# Patient Record
Sex: Female | Born: 1965 | Race: White | Hispanic: No | Marital: Married | Smoking: Current every day smoker
Health system: Southern US, Community
[De-identification: ages and names within clinical notes are randomized; demographics above are authoritative.]

## PROBLEM LIST (undated history)

## (undated) DIAGNOSIS — Z8489 Family history of other specified conditions: Secondary | ICD-10-CM

## (undated) HISTORY — PX: MASTECTOMY: SHX3

## (undated) HISTORY — DX: Family history of other specified conditions: Z84.89

---

## 2021-03-28 ENCOUNTER — Emergency Department
Admission: EM | Admit: 2021-03-28 | Discharge: 2021-03-28 | Disposition: A | Discharge status: Home / Self Care | Payer: Managed Care, Other (non HMO) | Attending: Emergency Medicine | Admitting: Emergency Medicine

## 2021-03-28 ENCOUNTER — Other Ambulatory Visit: Payer: Self-pay

## 2021-03-28 ENCOUNTER — Emergency Department: Payer: Managed Care, Other (non HMO)

## 2021-03-28 DIAGNOSIS — K5732 Diverticulitis of large intestine without perforation or abscess without bleeding: Secondary | ICD-10-CM | POA: Diagnosis not present

## 2021-03-28 DIAGNOSIS — R7989 Other specified abnormal findings of blood chemistry: Secondary | ICD-10-CM

## 2021-03-28 DIAGNOSIS — R945 Abnormal results of liver function studies: Secondary | ICD-10-CM | POA: Insufficient documentation

## 2021-03-28 DIAGNOSIS — R1032 Left lower quadrant pain: Secondary | ICD-10-CM | POA: Diagnosis present

## 2021-03-28 DIAGNOSIS — K5792 Diverticulitis of intestine, part unspecified, without perforation or abscess without bleeding: Secondary | ICD-10-CM

## 2021-03-28 LAB — CBC
HCT: 43.8 % (ref 36.0–46.0)
Hemoglobin: 14.4 g/dL (ref 12.0–15.0)
MCH: 30.1 pg (ref 26.0–34.0)
MCHC: 32.9 g/dL (ref 30.0–36.0)
MCV: 91.6 fL (ref 80.0–100.0)
Platelets: 146 10*3/uL — ABNORMAL LOW (ref 150–400)
RBC: 4.78 MIL/uL (ref 3.87–5.11)
RDW: 13.2 % (ref 11.5–15.5)
WBC: 6.7 10*3/uL (ref 4.0–10.5)
nRBC: 0 % (ref 0.0–0.2)

## 2021-03-28 LAB — URINALYSIS, ROUTINE W REFLEX MICROSCOPIC
Bacteria, UA: NONE SEEN
Bilirubin Urine: NEGATIVE
Glucose, UA: NEGATIVE mg/dL
Hgb urine dipstick: NEGATIVE
Ketones, ur: NEGATIVE mg/dL
Nitrite: NEGATIVE
Protein, ur: NEGATIVE mg/dL
Specific Gravity, Urine: 1.027 (ref 1.005–1.030)
pH: 5 (ref 5.0–8.0)

## 2021-03-28 LAB — COMPREHENSIVE METABOLIC PANEL
ALT: 544 U/L — ABNORMAL HIGH (ref 0–44)
AST: 438 U/L — ABNORMAL HIGH (ref 15–41)
Albumin: 3.7 g/dL (ref 3.5–5.0)
Alkaline Phosphatase: 77 U/L (ref 38–126)
Anion gap: 6 (ref 5–15)
BUN: 20 mg/dL (ref 6–20)
CO2: 26 mmol/L (ref 22–32)
Calcium: 9.3 mg/dL (ref 8.9–10.3)
Chloride: 107 mmol/L (ref 98–111)
Creatinine, Ser: 0.74 mg/dL (ref 0.44–1.00)
GFR, Estimated: >60 mL/min (ref >60)
Glucose, Bld: 126 mg/dL — ABNORMAL HIGH (ref 70–99)
Potassium: 4.2 mmol/L (ref 3.5–5.1)
Sodium: 139 mmol/L (ref 135–145)
Total Bilirubin: 0.9 mg/dL (ref 0.3–1.2)
Total Protein: 7.5 g/dL (ref 6.5–8.1)

## 2021-03-28 LAB — ACETAMINOPHEN LEVEL: Acetaminophen (Tylenol), Serum: <10 ug/mL — ABNORMAL LOW (ref 10–30)

## 2021-03-28 LAB — LIPASE, BLOOD: Lipase: 32 U/L (ref 11–51)

## 2021-03-28 MED ORDER — ONDANSETRON HCL 4 MG/2ML IJ SOLN
4.0000 mg | Freq: Once | INTRAMUSCULAR | Status: AC
  Administered 2021-03-28: 4 mg via INTRAVENOUS
  Filled 2021-03-28: qty 2

## 2021-03-28 MED ORDER — PIPERACILLIN-TAZOBACTAM 3.375 G IVPB 30 MIN
3.3750 g | Freq: Once | INTRAVENOUS | Status: AC
  Administered 2021-03-28: 3.375 g via INTRAVENOUS
  Filled 2021-03-28: qty 50

## 2021-03-28 MED ORDER — OXYCODONE HCL 5 MG PO TABS
5.0000 mg | ORAL_TABLET | Freq: Once | ORAL | Status: AC
  Administered 2021-03-28: 5 mg via ORAL
  Filled 2021-03-28: qty 1

## 2021-03-28 MED ORDER — OXYCODONE HCL 5 MG PO TABS: 5.0000 mg | ORAL_TABLET | Freq: Four times a day (QID) | ORAL | 0 refills | Status: AC | PRN

## 2021-03-28 MED ORDER — SODIUM CHLORIDE 0.9 % IV BOLUS
500.0000 mL | Freq: Once | INTRAVENOUS | Status: AC
  Administered 2021-03-28: 500 mL via INTRAVENOUS

## 2021-03-28 MED ORDER — MORPHINE SULFATE (PF) 4 MG/ML IV SOLN
4.0000 mg | Freq: Once | INTRAVENOUS | Status: AC
  Administered 2021-03-28: 4 mg via INTRAVENOUS
  Filled 2021-03-28: qty 1

## 2021-03-28 MED ORDER — AMOXICILLIN-POT CLAVULANATE 875-125 MG PO TABS: 1.0000 | ORAL_TABLET | Freq: Two times a day (BID) | ORAL | 0 refills | Status: AC

## 2021-03-28 MED ORDER — IOHEXOL 300 MG/ML  SOLN
80.0000 mL | Freq: Once | INTRAMUSCULAR | Status: AC | PRN
  Administered 2021-03-28: 80 mL via INTRAVENOUS

## 2021-03-28 MED ORDER — ONDANSETRON HCL 8 MG PO TABS: 8.0000 mg | ORAL_TABLET | Freq: Three times a day (TID) | ORAL | 0 refills | Status: DC | PRN

## 2021-03-28 NOTE — ED Triage Notes (Signed)
Pt with c/o LLQ pain x 2 weeks. Pt state she has a hx of colitis. Pt states she had diarrhea but that has stopped but the pain is getting worse. Pt denies N/V. Pt states stool was dark. But now is more normal ?

## 2021-03-28 NOTE — ED Provider Notes (Signed)
? ?[facility] ?Provider Note ? ? ? Event Date/Time  ? First MD Initiated Contact with Patient 03/28/21 1653   ?  (approximate) ? ? ?History  ? ?Abdominal Pain ? ? ?HPI ? ?Nathanial MillmanCheryl Wach is a [age] y.o. female with a prior history of colitis who presents with left lower quadrant abdominal pain for the last 2 weeks, worsening over the last few days, associated with diarrhea initially, but this has been resolving.  She has had no blood in her stool.  She thought that she was feeling better for a few days until the pain worsened.  She states it feels similar to when she had colitis previously.  She denies any vomiting or fever.  She denies any urinary symptoms. ? ? ? ?Physical Exam  ? ?Triage Vital Signs: ?ED Triage Vitals  ?Enc Vitals Group  ?   BP 03/28/21 1639 (!) 162/87  ?   Pulse Rate 03/28/21 1639 (!) 102  ?   Resp 03/28/21 1639 20  ?   Temp 03/28/21 1639 97.8 ?F (36.6 ?C)  ?   Temp Source 03/28/21 1639 Oral  ?   SpO2 03/28/21 1639 99 %  ?   Weight 03/28/21 1642 120 lb (54.4 kg)  ?   Height 03/28/21 1642 5\' 2"  (1.575 m)  ?   Head Circumference --   ?   Peak Flow --   ?   Pain Score 03/28/21 1642 8  ?   Pain Loc --   ?   Pain Edu? --   ?   Excl. in GC? --   ? ? ?Most recent vital signs: ?Vitals:  ? 03/28/21 2130 03/28/21 2230  ?BP: 120/66 117/67  ?Pulse: 81 78  ?Resp: 16 19  ?Temp:    ?SpO2: 97% 100%  ? ? ? ?General: Alert, uncomfortable appearing but in no acute distress. ?CV:  Good peripheral perfusion.  ?Resp:  Normal effort.  ?Abd:  Soft with moderate left lower quadrant tenderness.  No distention.  ?Other:  No scleral icterus.  Dry mucous membranes. ? ? ? ?ED Results / Procedures / Treatments  ? ?Labs ?(all labs ordered are listed, but only abnormal results are displayed) ?Labs Reviewed  ?COMPREHENSIVE METABOLIC PANEL - Abnormal; Notable for the following components:  ?    Result Value  ? Glucose, Bld 126 (*)   ? AST 438 (*)   ? ALT 544 (*)   ? All other components within normal limits   ?CBC - Abnormal; Notable for the following components:  ? Platelets 146 (*)   ? All other components within normal limits  ?URINALYSIS, ROUTINE W REFLEX MICROSCOPIC - Abnormal; Notable for the following components:  ? Color, Urine AMBER (*)   ? APPearance CLOUDY (*)   ? Leukocytes,Ua SMALL (*)   ? All other components within normal limits  ?ACETAMINOPHEN LEVEL - Abnormal; Notable for the following components:  ? Acetaminophen (Tylenol), Serum <10 (*)   ? All other components within normal limits  ?LIPASE, BLOOD  ?HEPATITIS PANEL, ACUTE  ? ? ? ?EKG ? ?ED ECG REPORT ?IDionne Bucy, Llana Deshazo, the attending physician, personally viewed and interpreted this ECG. ? ?Date: 03/28/2021 ?EKG Time: 1649 ?Rate: 96 ?Rhythm: normal sinus rhythm ?QRS Axis: normal ?Intervals: normal ?ST/T Wave abnormalities: normal ?Narrative Interpretation: no evidence of acute ischemia ? ? ? ?RADIOLOGY ? ?CT abdomen/pelvis: Dependently viewed and interpreted the images; there is diverticulitis of the colon with no evidence of perforation, abscess, or other complication ? ? ?  PROCEDURES: ? ?Critical Care performed: No ? ?Procedures ? ? ?MEDICATIONS ORDERED IN ED: ?Medications  ?ondansetron (ZOFRAN) injection 4 mg (4 mg Intravenous Given 03/28/21 1831)  ?morphine (PF) 4 MG/ML injection 4 mg (4 mg Intravenous Given 03/28/21 1831)  ?sodium chloride 0.9 % bolus 500 mL (0 mLs Intravenous Stopped 03/28/21 2034)  ?iohexol (OMNIPAQUE) 300 MG/ML solution 80 mL (80 mLs Intravenous Contrast Given 03/28/21 1843)  ?piperacillin-tazobactam (ZOSYN) IVPB 3.375 g (0 g Intravenous Stopped 03/28/21 2139)  ?morphine (PF) 4 MG/ML injection 4 mg (4 mg Intravenous Given 03/28/21 2029)  ?oxyCODONE (Oxy IR/ROXICODONE) immediate release tablet 5 mg (5 mg Oral Given 03/28/21 2317)  ? ? ? ?IMPRESSION / MDM / ASSESSMENT AND PLAN / ED COURSE  ?I reviewed the triage vital signs and the nursing notes. ? ?56 year old female with PMH as noted above presents with left lower quadrant pain  over the last [redacted] weeks along with diarrhea which has mostly subsided.  It feels similar to prior episodes of colitis. ? ?Differential diagnosis includes, but is not limited to, infectious or inflammatory colitis, IBD, diverticulitis, gastroenteritis, ureteral stone, pyelonephritis.  We will obtain lab work-up, CT abdomen, give analgesia and fluids and reassess. ? ?----------------------------------------- ?11:19 PM on 03/28/2021 ?----------------------------------------- ? ?CT shows uncomplicated diverticulitis. ? ?Lab work-up is overall reassuring.  There is no leukocytosis.  LFTs are elevated but the bilirubin and alkaline phosphatase are normal. ? ?The patient has a history of hepatitis C which is untreated.  I consulted Dr. Mia Creek from GI and discussed the case with him.  He recommends getting an acute hepatitis panel and checking an acetaminophen level.  He advises that the elevated LFTs could be inflammatory or related to the patient's hepatitis C, and she should be appropriate for outpatient follow-up.  We have no prior labs to compare with since the patient recently moved to the area and there are no prior records in care everywhere. ? ?On reassessment, the patient reported significant improvement in her pain.  I did consider whether she may require admission, and discussed with the patient, however her vital signs are normal, she has been tolerating p.o., and does not have complications such as abscess or perforation.  Therefore she is appropriate for discharge home with outpatient treatment.  The patient herself strongly prefers discharge if possible. ? ?We gave a dose of antibiotics here.  The acetaminophen level is normal.  Per the lab, the hepatitis panel takes several days to result.  Therefore, we will not keep the patient here for this. ? ?I counseled the patient on the results of the work-up and the plan of care including follow-up with GI.  I gave her thorough return precautions and she expressed  understanding. ? ? ?FINAL CLINICAL IMPRESSION(S) / ED DIAGNOSES  ? ?Final diagnoses:  ?Diverticulitis  ?Elevated LFTs  ? ? ? ?Rx / DC Orders  ? ?ED Discharge Orders   ? ?      Ordered  ?  oxyCODONE (ROXICODONE) 5 MG immediate release tablet  Every 6 hours PRN       ? 03/28/21 2316  ?  amoxicillin-clavulanate (AUGMENTIN) 875-125 MG tablet  2 times daily       ? 03/28/21 2316  ?  ondansetron (ZOFRAN) 8 MG tablet  Every 8 hours PRN       ? 03/28/21 2316  ? ?  ?  ? ?  ? ? ? ?Note:  This document was prepared using Dragon voice recognition software and may include unintentional dictation errors.  ?  ?  Dionne Bucy, MD ?03/28/21 2325 ? ?

## 2021-03-28 NOTE — Discharge Instructions (Addendum)
Your liver enzymes (AST and ALT) are somewhat elevated.  This may be due to inflammation.  We have given you a referral to a gastroenterologist.  The office should contact you within the next few days to schedule an appointment but if you do not hear from them you should call. ? ?Take the antibiotic as prescribed and finish the full course even if you are feeling better.  You may take the oxycodone as needed for pain over the next few days, and the Zofran as needed for nausea. ? ?Return to the ER for new, worsening, or persistent severe pain, vomiting or inability take the medication, fever or chills, blood in the stool, jaundice, or any other new or worsening symptoms that concern you. ?

## 2021-03-29 LAB — HEPATITIS PANEL, ACUTE
HCV Ab: REACTIVE — AB
Hep A IgM: NONREACTIVE
Hep B C IgM: NONREACTIVE
Hepatitis B Surface Ag: NONREACTIVE

## 2021-05-16 ENCOUNTER — Emergency Department: Payer: Managed Care, Other (non HMO)

## 2021-05-16 ENCOUNTER — Other Ambulatory Visit: Payer: Self-pay

## 2021-05-16 ENCOUNTER — Emergency Department
Admission: EM | Admit: 2021-05-16 | Discharge: 2021-05-16 | Disposition: A | Discharge status: Home / Self Care | Payer: Managed Care, Other (non HMO) | Attending: Student in an Organized Health Care Education/Training Program | Admitting: Student in an Organized Health Care Education/Training Program

## 2021-05-16 DIAGNOSIS — S82001A Unspecified fracture of right patella, initial encounter for closed fracture: Secondary | ICD-10-CM

## 2021-05-16 DIAGNOSIS — S82041A Displaced comminuted fracture of right patella, initial encounter for closed fracture: Secondary | ICD-10-CM | POA: Diagnosis not present

## 2021-05-16 DIAGNOSIS — W01198A Fall on same level from slipping, tripping and stumbling with subsequent striking against other object, initial encounter: Secondary | ICD-10-CM | POA: Insufficient documentation

## 2021-05-16 DIAGNOSIS — M542 Cervicalgia: Secondary | ICD-10-CM | POA: Diagnosis not present

## 2021-05-16 DIAGNOSIS — F10129 Alcohol abuse with intoxication, unspecified: Secondary | ICD-10-CM | POA: Insufficient documentation

## 2021-05-16 DIAGNOSIS — W19XXXA Unspecified fall, initial encounter: Secondary | ICD-10-CM

## 2021-05-16 DIAGNOSIS — S0990XA Unspecified injury of head, initial encounter: Secondary | ICD-10-CM | POA: Diagnosis not present

## 2021-05-16 DIAGNOSIS — S8991XA Unspecified injury of right lower leg, initial encounter: Secondary | ICD-10-CM | POA: Diagnosis present

## 2021-05-16 MED ORDER — ONDANSETRON 4 MG PO TBDP
4.0000 mg | ORAL_TABLET | Freq: Once | ORAL | Status: AC
  Administered 2021-05-16: 4 mg via ORAL
  Filled 2021-05-16: qty 1

## 2021-05-16 MED ORDER — OXYCODONE-ACETAMINOPHEN 5-325 MG PO TABS: 1.0000 | ORAL_TABLET | ORAL | 0 refills | Status: DC | PRN

## 2021-05-16 MED ORDER — MORPHINE SULFATE (PF) 4 MG/ML IV SOLN
4.0000 mg | Freq: Once | INTRAVENOUS | Status: AC
  Administered 2021-05-16: 4 mg via INTRAMUSCULAR
  Filled 2021-05-16: qty 1

## 2021-05-16 MED ORDER — OXYCODONE-ACETAMINOPHEN 5-325 MG PO TABS
1.0000 | ORAL_TABLET | Freq: Once | ORAL | Status: AC
  Administered 2021-05-16: 1 via ORAL
  Filled 2021-05-16: qty 1

## 2021-05-16 NOTE — ED Provider Notes (Signed)
? ?[facility] ?Provider Note ? ? ? Event Date/Time  ? First MD Initiated Contact with Patient 05/16/21 1326   ?  (approximate) ? ? ?History  ? ?Fall ? ? ?HPI ? ?Marcus Groll is a 56 y.o. female with no pertinent past medical history.  Patient states she was intoxicated last night and fell.  Family member states she also did hit her head.  Unsure if she lost consciousness.  Patient is unable to move the right knee.  Has large amount of swelling.  Cannot walk on it without pain.  States she has had believe it in a bent position.  She denies vomiting., ? ?  ? ? ?Physical Exam  ? ?Triage Vital Signs: ?ED Triage Vitals  ?Enc Vitals Group  ?   BP 05/16/21 1246 (!) 175/93  ?   Pulse Rate 05/16/21 1246 (!) 118  ?   Resp 05/16/21 1246 20  ?   Temp 05/16/21 1246 98.5 ?F (36.9 ?C)  ?   Temp Source 05/16/21 1246 Oral  ?   SpO2 05/16/21 1246 96 %  ?   Weight 05/16/21 1247 115 lb (52.2 kg)  ?   Height 05/16/21 1247 5\' 2"  (1.575 m)  ?   Head Circumference --   ?   Peak Flow --   ?   Pain Score 05/16/21 1247 10  ?   Pain Loc --   ?   Pain Edu? --   ?   Excl. in GC? --   ? ? ?Most recent vital signs: ?Vitals:  ? 05/16/21 1354 05/16/21 1600  ?BP: (!) 149/58 (!) 151/64  ?Pulse: 95 80  ?Resp: (!) 22 18  ?Temp:    ?SpO2: 98% 95%  ? ? ? ?General: Awake, no distress.   ?CV:  Good peripheral perfusion. regular rate and  rhythm ?Resp:  Normal effort.  ?Abd:  No distention.   ?Other:  Skull is nontender, minimal tenderness of the C-spine, right knee is swollen and tender, patient is unable to straighten the leg or bend the knee.  She is able to move her toes but unable to move the ankle secondary to pain ? ? ?ED Results / Procedures / Treatments  ? ?Labs ?(all labs ordered are listed, but only abnormal results are displayed) ?Labs Reviewed - No data to display ? ? ?EKG ? ? ? ? ?RADIOLOGY ?X-ray of the right knee ?CT of the head, C-spine and right knee ? ? ? ?PROCEDURES: ? ? ?Procedures ? ? ?MEDICATIONS ORDERED IN  ED: ?Medications  ?morphine (PF) 4 MG/ML injection 4 mg (4 mg Intramuscular Given 05/16/21 1356)  ?ondansetron (ZOFRAN-ODT) disintegrating tablet 4 mg (4 mg Oral Given 05/16/21 1356)  ?oxyCODONE-acetaminophen (PERCOCET/ROXICET) 5-325 MG per tablet 1 tablet (1 tablet Oral Given 05/16/21 1619)  ? ? ? ?IMPRESSION / MDM / ASSESSMENT AND PLAN / ED COURSE  ?I reviewed the triage vital signs and the nursing notes. ?             ?               ? ?Differential diagnosis includes, but is not limited to, patella fracture, tibial plateau fracture, effusion, contusion, CVA, SAH, subdural hematoma, C-spine fracture ? ?X-ray of the right knee shows a patella fracture, is comminuted and displaced.  Due to the amount of swelling and inability to straighten the knee we will add a CT of the right knee to look for internal derangement. ? ?Also due to her  being intoxicated during the fall and not remembering the fall, will do CT of the head and C-spine ? ?Morphine 4 mg IM and Zofran ODT were ordered. ? ?CT of the head and C-spine were independently reviewed by me and do not show any acute abnormalities.  Confirmed by radiology ? ?CT of the right knee is independently reviewed by me confirms comminuted fracture of the patella, no tibial plateau fracture noted.  Confirmed by radiology ? ?The patient did have relief with morphine.  Gave her Percocet prior to discharge.  Secure message sent to Dr. Okey Dupre for follow-up appointment.  Patient was placed in a knee immobilizer and given a walker.  She is not to bear weight on the leg.  She is to leave the knee immobilizer on until evaluated by orthopedics.  Ply ice and elevate.  Patient is in agreement treatment plan.  She was discharged stable condition. ? ? ?  ? ? ?FINAL CLINICAL IMPRESSION(S) / ED DIAGNOSES  ? ?Final diagnoses:  ?Closed displaced fracture of right patella, unspecified fracture morphology, initial encounter  ?Fall, initial encounter  ?Minor head injury, initial encounter   ? ? ? ?Rx / DC Orders  ? ?ED Discharge Orders   ? ?      Ordered  ?  oxyCODONE-acetaminophen (PERCOCET) 5-325 MG tablet  Every 4 hours PRN       ? 05/16/21 1717  ? ?  ?  ? ?  ? ? ? ?Note:  This document was prepared using Dragon voice recognition software and may include unintentional dictation errors. ? ?  ?Faythe Ghee, PA-C ?05/16/21 1727 ? ?  ?Willy Eddy, MD ?05/16/21 2005 ? ?

## 2021-05-16 NOTE — ED Notes (Signed)
Pink sleeve placed on patient's right arm due to right-side mastectomy. ?

## 2021-05-16 NOTE — ED Triage Notes (Signed)
Pt arrive with c/o right knee pain after she fell last night. Pt was intoxicated when she fell and does not remember the fall.  ?

## 2021-05-16 NOTE — Discharge Instructions (Signed)
Follow-up with your regular doctor as needed.  Follow-up with Dr. Okey Dupre at [facility] for evaluation of your patella fracture.  Leave the knee immobilizer, cover it with a bag when you get in the shower, use the walker to not bear weight.  Return the emergency department for any worsening.  Take Percocet as needed for pain. ?

## 2021-12-22 ENCOUNTER — Ambulatory Visit (INDEPENDENT_AMBULATORY_CARE_PROVIDER_SITE_OTHER): Payer: Managed Care, Other (non HMO) | Admitting: Nurse Practitioner

## 2021-12-22 ENCOUNTER — Encounter: Payer: Self-pay | Admitting: Nurse Practitioner

## 2021-12-22 VITALS — BP 128/86 | HR 64 | Ht 62.0 in | Wt 127.0 lb

## 2021-12-22 DIAGNOSIS — Z8619 Personal history of other infectious and parasitic diseases: Secondary | ICD-10-CM | POA: Insufficient documentation

## 2021-12-22 DIAGNOSIS — F32A Depression, unspecified: Secondary | ICD-10-CM

## 2021-12-22 DIAGNOSIS — Z853 Personal history of malignant neoplasm of breast: Secondary | ICD-10-CM

## 2021-12-22 DIAGNOSIS — G4700 Insomnia, unspecified: Secondary | ICD-10-CM

## 2021-12-22 MED ORDER — SERTRALINE HCL 50 MG PO TABS: 75.0000 mg | ORAL_TABLET | Freq: Every day | ORAL | 3 refills | Status: DC

## 2021-12-22 MED ORDER — TRAZODONE HCL 50 MG PO TABS: 25.0000 mg | ORAL_TABLET | Freq: Every evening | ORAL | 3 refills | Status: DC | PRN

## 2021-12-22 NOTE — Assessment & Plan Note (Signed)
-   Ambulatory referral to Gastroenterology - CBC - Comprehensive metabolic panel  2. Depression, unspecified depression type  - sertraline (ZOLOFT) 50 MG tablet; Take 1.5 tablets (75 mg total) by mouth daily.  Dispense: 30 tablet; Refill: 3 - CBC - Comprehensive metabolic panel  3. Insomnia, unspecified type  - traZODone (DESYREL) 50 MG tablet; Take 0.5-1 tablets (25-50 mg total) by mouth at bedtime as needed for sleep.  Dispense: 30 tablet; Refill: 3   4. History of breast cancer  - Ambulatory referral to Hematology / Oncology    Follow up:  Follow up in 3 months

## 2021-12-22 NOTE — Patient Instructions (Addendum)
1. History of hepatitis C  - Ambulatory referral to Gastroenterology - CBC - Comprehensive metabolic panel  2. Depression, unspecified depression type  - sertraline (ZOLOFT) 50 MG tablet; Take 1.5 tablets (75 mg total) by mouth daily.  Dispense: 30 tablet; Refill: 3 - CBC - Comprehensive metabolic panel  3. Insomnia, unspecified type  - traZODone (DESYREL) 50 MG tablet; Take 0.5-1 tablets (25-50 mg total) by mouth at bedtime as needed for sleep.  Dispense: 30 tablet; Refill: 3   4. History of breast cancer  - Ambulatory referral to Hematology / Oncology    Follow up:  Follow up in 3 months

## 2021-12-22 NOTE — Progress Notes (Signed)
@Patient  ID: , female    DOB: September 13, 1965, 56 y.o.   MRN: [id number]  Chief Complaint  Patient presents with   New Patient (Initial Visit)    Pt stated--need antidepressant medication and treatment for Hep C.    Referring provider: No ref. provider found   HPI  Patient presents today to establish care.  She states that she was diagnosed with hepatitis C 4 years ago when [place] was [phone].  She also states that she has a history of breast cancer from 5 years ago with mastectomy.  This was also in [place].  Patient did not follow-up on treatment for either of these issues.  We will refer her to GI and oncology today for follow-up on this.  Patient stated she is having bone pain and is concerned that her breast cancer has metastasized to her bones.  We will try to request records from healthcare system in [place].  Denies f/c/s, n/v/d, hemoptysis, PND, leg swelling Denies chest pain or edema     No Known Allergies   There is no immunization history on file for this patient.  Past Medical History:  Diagnosis Date   FH: mastectomy     Tobacco History: Social History   Tobacco Use  Smoking Status Every Day   Types: Cigarettes  Smokeless Tobacco Never   Ready to quit: Not Answered Counseling given: Not Answered   Outpatient Encounter Medications as of 12/22/2021  Medication Sig   sertraline (ZOLOFT) 50 MG tablet Take 1.5 tablets (75 mg total) by mouth daily.   traZODone (DESYREL) 50 MG tablet Take 0.5-1 tablets (25-50 mg total) by mouth at bedtime as needed for sleep.   [DISCONTINUED] ondansetron (ZOFRAN) 8 MG tablet Take 1 tablet (8 mg total) by mouth every 8 (eight) hours as needed for nausea or vomiting.   [DISCONTINUED] oxyCODONE-acetaminophen (PERCOCET) 5-325 MG tablet Take 1 tablet by mouth every 4 (four) hours as needed for severe pain.   No facility-administered encounter medications on file as of 12/22/2021.     Review of Systems  Review  of Systems  Constitutional: Negative.   HENT: Negative.    Cardiovascular: Negative.   Gastrointestinal: Negative.   Allergic/Immunologic: Negative.   Neurological: Negative.   Psychiatric/Behavioral: Negative.         Physical Exam  BP 128/86   Pulse 64   Ht 5\' 2"  (1.575 m)   Wt 127 lb (57.6 kg)   SpO2 98%   BMI 23.23 kg/m   Wt Readings from Last 5 Encounters:  12/22/21 127 lb (57.6 kg)  05/16/21 115 lb (52.2 kg)  03/28/21 120 lb (54.4 kg)     Physical Exam Vitals and nursing note reviewed.  Constitutional:      General: She is not in acute distress.    Appearance: She is well-developed.  Cardiovascular:     Rate and Rhythm: Normal rate and regular rhythm.  Pulmonary:     Effort: Pulmonary effort is normal.     Breath sounds: Normal breath sounds.  Neurological:     Mental Status: She is alert and oriented to person, place, and time.         Assessment & Plan:   History of hepatitis C - Ambulatory referral to Gastroenterology - CBC - Comprehensive metabolic panel  2. Depression, unspecified depression type  - sertraline (ZOLOFT) 50 MG tablet; Take 1.5 tablets (75 mg total) by mouth daily.  Dispense: 30 tablet; Refill: 3 - CBC - Comprehensive metabolic panel  3. Insomnia, unspecified type  - traZODone (DESYREL) 50 MG tablet; Take 0.5-1 tablets (25-50 mg total) by mouth at bedtime as needed for sleep.  Dispense: 30 tablet; Refill: 3   4. History of breast cancer  - Ambulatory referral to Hematology / Oncology    Follow up:  Follow up in 3 months     Ivonne Andrew, NP 12/22/2021

## 2021-12-23 ENCOUNTER — Other Ambulatory Visit: Payer: Self-pay | Admitting: Nurse Practitioner

## 2021-12-23 DIAGNOSIS — Z8619 Personal history of other infectious and parasitic diseases: Secondary | ICD-10-CM

## 2021-12-23 LAB — COMPREHENSIVE METABOLIC PANEL
ALT: 172 IU/L — ABNORMAL HIGH (ref 0–32)
AST: 160 IU/L — ABNORMAL HIGH (ref 0–40)
Albumin/Globulin Ratio: 1.3 (ref 1.2–2.2)
Albumin: 4.5 g/dL (ref 3.8–4.9)
Alkaline Phosphatase: 117 IU/L (ref 44–121)
BUN/Creatinine Ratio: 18 (ref 9–23)
BUN: 14 mg/dL (ref 6–24)
Bilirubin Total: 0.4 mg/dL (ref 0.0–1.2)
CO2: 23 mmol/L (ref 20–29)
Calcium: 10.3 mg/dL — ABNORMAL HIGH (ref 8.7–10.2)
Chloride: 104 mmol/L (ref 96–106)
Creatinine, Ser: 0.78 mg/dL (ref 0.57–1.00)
Globulin, Total: 3.6 g/dL (ref 1.5–4.5)
Glucose: 87 mg/dL (ref 70–99)
Potassium: 4.3 mmol/L (ref 3.5–5.2)
Sodium: 142 mmol/L (ref 134–144)
Total Protein: 8.1 g/dL (ref 6.0–8.5)
eGFR: 89 mL/min/{1.73_m2} (ref >59)

## 2021-12-23 LAB — CBC
Hematocrit: 42.9 % (ref 34.0–46.6)
Hemoglobin: 14.4 g/dL (ref 11.1–15.9)
MCH: 30.6 pg (ref 26.6–33.0)
MCHC: 33.6 g/dL (ref 31.5–35.7)
MCV: 91 fL (ref 79–97)
Platelets: 204 10*3/uL (ref 150–450)
RBC: 4.7 x10E6/uL (ref 3.77–5.28)
RDW: 13.1 % (ref 11.7–15.4)
WBC: 6.5 10*3/uL (ref 3.4–10.8)

## 2021-12-30 ENCOUNTER — Encounter: Payer: Self-pay | Admitting: Infectious Diseases

## 2022-02-01 ENCOUNTER — Ambulatory Visit (INDEPENDENT_AMBULATORY_CARE_PROVIDER_SITE_OTHER): Payer: 59 | Admitting: Internal Medicine

## 2022-02-01 ENCOUNTER — Other Ambulatory Visit (HOSPITAL_COMMUNITY): Payer: Self-pay

## 2022-02-01 ENCOUNTER — Encounter: Payer: Self-pay | Admitting: Internal Medicine

## 2022-02-01 ENCOUNTER — Other Ambulatory Visit: Payer: Self-pay

## 2022-02-01 ENCOUNTER — Telehealth: Payer: Self-pay

## 2022-02-01 VITALS — BP 154/87 | HR 70 | Temp 97.8°F | Wt 130.0 lb

## 2022-02-01 DIAGNOSIS — B182 Chronic viral hepatitis C: Secondary | ICD-10-CM | POA: Diagnosis not present

## 2022-02-01 DIAGNOSIS — R69 Illness, unspecified: Secondary | ICD-10-CM | POA: Diagnosis not present

## 2022-02-01 NOTE — Progress Notes (Signed)
[name]      Reason for Consult: chronic hepatitis C    Referring Physician: Eduardo Osier, NP    Patient ID: Carrie Holt, female    DOB: [birth year], 57 y.o.   MRN: [id number]  HPI:   Carrie Holt is here for evaluation for known chronic hepatitis C. She was diagnosed last year in [facility] but did not start treatment.  She has a long history of significant fatigue and attributes it to hepatitis C.  She suspects she has had it for decades.  She has increased AST and ALT.  She is interested in treatment.  Also with a history of breast cancer and going to oncology.   Past Medical History:  Diagnosis Date   FH: mastectomy     Prior to Admission medications   Medication Sig Start Date End Date Taking? Authorizing Provider  sertraline (ZOLOFT) 50 MG tablet Take 1.5 tablets (75 mg total) by mouth daily. 12/22/21  Yes Fenton Foy, NP  traZODone (DESYREL) 50 MG tablet Take 0.5-1 tablets (25-50 mg total) by mouth at bedtime as needed for sleep. 12/22/21  Yes Fenton Foy, NP    No Known Allergies  Social History   Tobacco Use   Smoking status: Every Day    Types: E-cigarettes   Smokeless tobacco: Never   Tobacco comments:    Cutting down  Substance Use Topics   Alcohol use: Not Currently   Drug use: Never    Family History  Problem Relation Age of Onset   Heart attack Mother    Cancer Father    Heart attack Father     Review of Systems  Constitutional: positive for fatigue and malaise All other systems reviewed and are negative    Constitutional: in no apparent distress  Vitals:   02/01/22 0843  BP: (!) 154/87  Pulse: 70  Temp: 97.8 F (36.6 C)  SpO2: 99%   EYES: anicteric Respiratory: normal respiratory effort Musculoskeletal: no edema Skin: no rash  Labs: Lab Results  Component Value Date   WBC 6.5 12/22/2021   HGB 14.4 12/22/2021   HCT 42.9 12/22/2021   MCV 91 12/22/2021   PLT 204 12/22/2021    Lab Results  Component  Value Date   CREATININE 0.78 12/22/2021   BUN 14 12/22/2021   NA 142 12/22/2021   K 4.3 12/22/2021   CL 104 12/22/2021   CO2 23 12/22/2021    Lab Results  Component Value Date   ALT 172 (H) 12/22/2021   AST 160 (H) 12/22/2021   ALKPHOS 117 12/22/2021   BILITOT 0.4 12/22/2021     Assessment: likely chronic active hepatitis C.   Will confirm with labs but with previous positive by her report and notable transaminitis, suspect it is likely true.   Will get ultrasound to check for cirrhosis.  Discussed treatment options   Plan: 1)  labs today 2) ultrasound with elastography Will prescribe Harvoni or Epclusa per insurance once results come out She will follow up with me 3-4 weeks after starting treatment.

## 2022-02-01 NOTE — Telephone Encounter (Signed)
[facility] Patient Teacher, English as a foreign language completed.    The patient is insured through [employer].  Patient will have to try and fail Epclusa or Harvoni 1st before trying [place].   We will continue to follow to see if copay assistance is needed.  [name], [facility] Patient [facility] Phone: [phone] Fax:  [phone]

## 2022-02-03 ENCOUNTER — Ambulatory Visit (HOSPITAL_COMMUNITY)
Admission: RE | Admit: 2022-02-03 | Discharge: 2022-02-03 | Disposition: A | Discharge status: Home / Self Care | Payer: 59 | Source: Ambulatory Visit | Attending: Internal Medicine | Admitting: Internal Medicine

## 2022-02-03 DIAGNOSIS — B182 Chronic viral hepatitis C: Secondary | ICD-10-CM

## 2022-02-03 DIAGNOSIS — R69 Illness, unspecified: Secondary | ICD-10-CM | POA: Diagnosis not present

## 2022-02-04 LAB — HEPATITIS C RNA QUANTITATIVE
HCV Quantitative Log: 5.45 log IU/mL — ABNORMAL HIGH
HCV RNA, PCR, QN: 279000 IU/mL — ABNORMAL HIGH

## 2022-02-04 LAB — HEPATITIS C GENOTYPE

## 2022-02-04 LAB — HEPATITIS B CORE ANTIBODY, TOTAL: Hep B Core Total Ab: NONREACTIVE

## 2022-02-04 LAB — HIV ANTIBODY (ROUTINE TESTING W REFLEX): HIV 1&2 Ab, 4th Generation: NONREACTIVE

## 2022-02-04 LAB — HEPATITIS B SURFACE ANTIBODY,QUALITATIVE: Hep B S Ab: NONREACTIVE

## 2022-02-04 LAB — HEPATITIS A ANTIBODY, TOTAL: Hepatitis A AB,Total: NONREACTIVE

## 2022-02-04 LAB — HEPATITIS B SURFACE ANTIGEN: Hepatitis B Surface Ag: NONREACTIVE

## 2022-02-09 ENCOUNTER — Other Ambulatory Visit (HOSPITAL_COMMUNITY): Payer: Self-pay

## 2022-02-09 ENCOUNTER — Other Ambulatory Visit: Payer: Self-pay | Admitting: Internal Medicine

## 2022-02-09 MED ORDER — SOFOSBUVIR-VELPATASVIR 400-100 MG PO TABS
1.0000 | ORAL_TABLET | Freq: Every day | ORAL | 2 refills | Status: DC
  Filled 2022-02-09: qty 28, 28d supply, fill #0

## 2022-02-10 ENCOUNTER — Telehealth: Payer: Self-pay

## 2022-02-10 ENCOUNTER — Other Ambulatory Visit (HOSPITAL_COMMUNITY): Payer: Self-pay

## 2022-02-10 NOTE — Telephone Encounter (Signed)
[facility] Patient Advocate Encounter   Received notification from [employer] that prior authorization for Carrie Holt is required.   PA submitted on 02/10/22 Key [id number] Status is pending    [facility] will continue to follow.   [name], [facility] Patient [facility] Phone: [phone] Fax:  [phone]

## 2022-02-14 ENCOUNTER — Other Ambulatory Visit (HOSPITAL_COMMUNITY): Payer: Self-pay

## 2022-02-17 ENCOUNTER — Other Ambulatory Visit (HOSPITAL_COMMUNITY): Payer: Self-pay

## 2022-02-21 ENCOUNTER — Other Ambulatory Visit (HOSPITAL_COMMUNITY): Payer: Self-pay

## 2022-02-22 ENCOUNTER — Encounter: Payer: Self-pay | Admitting: Internal Medicine

## 2022-03-03 ENCOUNTER — Other Ambulatory Visit (HOSPITAL_COMMUNITY): Payer: Self-pay

## 2022-03-03 ENCOUNTER — Other Ambulatory Visit: Payer: Self-pay | Admitting: Pharmacist

## 2022-03-03 DIAGNOSIS — B182 Chronic viral hepatitis C: Secondary | ICD-10-CM

## 2022-03-03 MED ORDER — EPCLUSA 400-100 MG PO TABS: 1.0000 | ORAL_TABLET | Freq: Every day | ORAL | 2 refills | Status: AC

## 2022-03-07 ENCOUNTER — Telehealth: Payer: Self-pay

## 2022-03-07 NOTE — Telephone Encounter (Signed)
[facility] Patient Advocate Encounter  Prior Authorization for Carrie Holt [employer]) has been approved.    PA# [id number] [place] Effective dates: 03/05/22 through 05/28/22    [facility] will continue to follow.  [name], [facility] Patient [facility] Phone: [phone] Fax:  [phone]

## 2022-03-15 ENCOUNTER — Ambulatory Visit: Payer: 59 | Admitting: Internal Medicine

## 2022-03-23 ENCOUNTER — Ambulatory Visit (INDEPENDENT_AMBULATORY_CARE_PROVIDER_SITE_OTHER): Payer: 59 | Admitting: Nurse Practitioner

## 2022-03-23 ENCOUNTER — Encounter: Payer: Self-pay | Admitting: Nurse Practitioner

## 2022-03-23 VITALS — BP 157/80 | HR 81 | Temp 97.5°F | Ht 62.0 in | Wt 130.6 lb

## 2022-03-23 DIAGNOSIS — M79605 Pain in left leg: Secondary | ICD-10-CM

## 2022-03-23 DIAGNOSIS — M79672 Pain in left foot: Secondary | ICD-10-CM | POA: Diagnosis not present

## 2022-03-23 DIAGNOSIS — M79604 Pain in right leg: Secondary | ICD-10-CM

## 2022-03-23 DIAGNOSIS — M79671 Pain in right foot: Secondary | ICD-10-CM

## 2022-03-23 DIAGNOSIS — R21 Rash and other nonspecific skin eruption: Secondary | ICD-10-CM

## 2022-03-23 DIAGNOSIS — Z131 Encounter for screening for diabetes mellitus: Secondary | ICD-10-CM

## 2022-03-23 LAB — POCT GLYCOSYLATED HEMOGLOBIN (HGB A1C): Hemoglobin A1C: 5.8 % — AB (ref 4.0–5.6)

## 2022-03-23 MED ORDER — PREDNISONE 20 MG PO TABS: 20.0000 mg | ORAL_TABLET | Freq: Every day | ORAL | 0 refills | Status: DC

## 2022-03-23 MED ORDER — IBUPROFEN 600 MG PO TABS: 600.0000 mg | ORAL_TABLET | Freq: Three times a day (TID) | ORAL | 0 refills | Status: DC | PRN

## 2022-03-23 NOTE — Patient Instructions (Addendum)
1. Bilateral pain of leg and foot  - predniSONE (DELTASONE) 20 MG tablet; Take 1 tablet (20 mg total) by mouth daily with breakfast.  Dispense: 5 tablet; Refill: 0 - ibuprofen (ADVIL) 600 MG tablet; Take 1 tablet (600 mg total) by mouth every 8 (eight) hours as needed.  Dispense: 30 tablet; Refill: 0  2. Rash and other nonspecific skin eruption  - predniSONE (DELTASONE) 20 MG tablet; Take 1 tablet (20 mg total) by mouth daily with breakfast.  Dispense: 5 tablet; Refill: 0

## 2022-03-23 NOTE — Assessment & Plan Note (Signed)
2. Rash and other nonspecific skin eruption Rashes appears stated compared to the picture she presented on her phone.  Rashes not itchy, no discharge or redness noted - predniSONE (DELTASONE) 20 MG tablet; Take 1 tablet (20 mg total) by mouth daily with breakfast.  Dispense: 5 tablet; Refill: 0 - POCT glycosylated hemoglobin (Hb A1C)

## 2022-03-23 NOTE — Assessment & Plan Note (Addendum)
1. Bilateral pain of leg and foot  - predniSONE (DELTASONE) 20 MG tablet; Take 1 tablet (20 mg total) by mouth daily with breakfast.  Dispense: 5 tablet; Refill: 0 - ibuprofen (ADVIL) 600 MG tablet; Take 1 tablet (600 mg total) by mouth every 8 (eight) hours as needed.  Dispense: 30 tablet; Refill: 0 Patient told to take prednisone with meal.  For 5 days after 5 days take ibuprofen 600 mg every 8 hours as needed.  Follow-up with orthopedics Follow-up in the office in 1 week She has history of breast cancer she was concerned about her breast cancer metastasizing to her bones during her last visit, referral to oncology is pending.  Will discuss with her PCP about her getting to see oncology soon.

## 2022-03-23 NOTE — Progress Notes (Addendum)
Acute Office Visit  Subjective:     Patient ID: Carrie Holt, female    DOB: 1965-08-19, 57 y.o.   MRN: [id number]  Chief Complaint  Patient presents with   Follow-up    Med check    HPI Carrie Holt with past medical history of hepatitis C, Breast cancer s/p mastectomy  presents with complaints of chronic bilateral leg pain, foot pain that is "worsening since the past 4 days.  During her last visit she had concerns with her breast cancer metastasizing to her bones, she was referred to oncology but she has not been seen yet due to not been able to get her medical records.  she describes the pain as generalized stabbing pain.  Feet goes numb sometimes, stated that her pain is worse at night.  She denies fever, chills,, shortness of breath, abnormal bleeding  Patient complains of rashes on both legs since the past 4 days ago.  She stated that the rashes does not itch She had similar rashes in the past that resolved without treatment.  She recently started taking medication for her chronic hepatitis C but does not think her rashes is related to the new medication.      Review of Systems  Constitutional: Negative.   Respiratory: Negative.    Cardiovascular: Negative.   Musculoskeletal:  Positive for joint pain and myalgias. Negative for back pain and falls.  Skin:  Positive for rash. Negative for itching.  Neurological:  Negative for dizziness, tingling, tremors, sensory change and headaches.  Endo/Heme/Allergies:  Does not bruise/bleed easily.  Psychiatric/Behavioral:  Negative for hallucinations, memory loss, substance abuse and suicidal ideas.         Objective:    BP (!) 157/80   Pulse 81   Temp (!) 97.5 F (36.4 C)   Ht '5\' 2"'$  (1.575 m)   Wt 130 lb 9.6 oz (59.2 kg)   SpO2 100%   BMI 23.89 kg/m    Physical Exam Constitutional:      General: She is not in acute distress.    Appearance: Normal appearance. She is not ill-appearing, toxic-appearing or diaphoretic.   Eyes:     General: No scleral icterus.       Right eye: No discharge.        Left eye: No discharge.     Extraocular Movements: Extraocular movements intact.  Cardiovascular:     Rate and Rhythm: Normal rate and regular rhythm.     Pulses: Normal pulses.     Holt sounds: Normal Holt sounds. No murmur heard.    No friction rub. No gallop.  Pulmonary:     Effort: Pulmonary effort is normal. No respiratory distress.     Breath sounds: Normal breath sounds. No stridor. No wheezing, rhonchi or rales.  Chest:     Chest wall: No tenderness.  Abdominal:     General: There is no distension.     Tenderness: There is no abdominal tenderness.  Musculoskeletal:        General: Tenderness present.     Comments: Tenderness on ROM of bilateral foot and bilateral knees, no redness, swelling noted. Skin warm and dry   Skin:    Findings: Rash present.     Comments: Multiple flat rashes noted on bilateral lower extremities, no redness, swelling, discharge noted.   Neurological:     Mental Status: She is alert and oriented to person, place, and time.     Motor: No weakness.     Coordination: Coordination  normal.     Gait: Gait abnormal.  Psychiatric:        Behavior: Behavior normal.        Thought Content: Thought content normal.        Judgment: Judgment normal.     Results for orders placed or performed in visit on 03/23/22  POCT glycosylated hemoglobin (Hb A1C)  Result Value Ref Range   Hemoglobin A1C 5.8 (A) 4.0 - 5.6 %   HbA1c POC (<> result, manual entry)     HbA1c, POC (prediabetic range)     HbA1c, POC (controlled diabetic range)          Assessment & Plan:   Problem List Items Addressed This Visit       Musculoskeletal and Integument   Rash and other nonspecific skin eruption    2. Rash and other nonspecific skin eruption Rashes appears stated compared to the picture she presented on her phone.  Rashes not itchy, no discharge or redness noted - predniSONE (DELTASONE)  20 MG tablet; Take 1 tablet (20 mg total) by mouth daily with breakfast.  Dispense: 5 tablet; Refill: 0 - POCT glycosylated hemoglobin (Hb A1C)      Relevant Medications   predniSONE (DELTASONE) 20 MG tablet     Other   Bilateral pain of leg and foot - Primary    1. Bilateral pain of leg and foot  - predniSONE (DELTASONE) 20 MG tablet; Take 1 tablet (20 mg total) by mouth daily with breakfast.  Dispense: 5 tablet; Refill: 0 - ibuprofen (ADVIL) 600 MG tablet; Take 1 tablet (600 mg total) by mouth every 8 (eight) hours as needed.  Dispense: 30 tablet; Refill: 0 Patient told to take prednisone with meal.  For 5 days after 5 days take ibuprofen 600 mg every 8 hours as needed.  Follow-up with orthopedics Follow-up in the office in 1 week She has history of breast cancer she was concerned about her breast cancer metastasizing to her bones during her last visit, referral to oncology is pending.  Will discuss with her PCP about her getting to see oncology soon.       Relevant Medications   predniSONE (DELTASONE) 20 MG tablet   ibuprofen (ADVIL) 600 MG tablet (Start on 03/28/2022)   Screening for diabetes mellitus   Relevant Orders   POCT glycosylated hemoglobin (Hb A1C) (Completed)    Meds ordered this encounter  Medications   predniSONE (DELTASONE) 20 MG tablet    Sig: Take 1 tablet (20 mg total) by mouth daily with breakfast.    Dispense:  5 tablet    Refill:  0   ibuprofen (ADVIL) 600 MG tablet    Sig: Take 1 tablet (600 mg total) by mouth every 8 (eight) hours as needed.    Dispense:  30 tablet    Refill:  0    Return in about 1 week (around 03/30/2022) for leg pain .  Renee Rival, FNP

## 2022-04-01 ENCOUNTER — Ambulatory Visit: Payer: Self-pay | Admitting: Nurse Practitioner

## 2022-04-14 ENCOUNTER — Encounter: Payer: Self-pay | Admitting: Internal Medicine

## 2022-04-14 ENCOUNTER — Ambulatory Visit (INDEPENDENT_AMBULATORY_CARE_PROVIDER_SITE_OTHER): Payer: 59 | Admitting: Internal Medicine

## 2022-04-14 ENCOUNTER — Other Ambulatory Visit: Payer: Self-pay

## 2022-04-14 VITALS — BP 141/82 | HR 64 | Temp 97.8°F | Ht 62.0 in | Wt 130.0 lb

## 2022-04-14 DIAGNOSIS — B182 Chronic viral hepatitis C: Secondary | ICD-10-CM

## 2022-04-14 DIAGNOSIS — R21 Rash and other nonspecific skin eruption: Secondary | ICD-10-CM

## 2022-04-14 DIAGNOSIS — K7400 Hepatic fibrosis, unspecified: Secondary | ICD-10-CM | POA: Insufficient documentation

## 2022-04-14 NOTE — Assessment & Plan Note (Signed)
New issue since starting the medication, now resolved.  Appearance in picture looks c/w a vasculitic type rash.  Though HCV is associated with vasculitis, it is not typical from treatment.  Resolved now but if it returns, would be worth a biopsy of the rash.

## 2022-04-14 NOTE — Progress Notes (Signed)
   Subjective:    Patient ID: Carrie Holt, female    DOB: [birth year], 57 y.o.   MRN: [id number]  HPI Najiyyah is here for follow up of chronic hepatitis C She has started on Epclusa about 5 weeks ago.  Some headaches daily but tolerable. Recenlty had leg swelling with a rash and completed prednisone.  Having issues with chronic back pain.   Review of Systems  Constitutional:  Negative for fatigue.  Gastrointestinal:  Negative for diarrhea.  Skin:  Negative for rash.       Objective:   Physical Exam Eyes:     General: No scleral icterus. Pulmonary:     Effort: Pulmonary effort is normal.  Neurological:     Mental Status: She is alert.    SH: no tobacco       Assessment & Plan:

## 2022-04-14 NOTE — Assessment & Plan Note (Signed)
Elastography noted and discussed with her.  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

## 2022-04-16 LAB — HEPATITIS C RNA QUANTITATIVE
HCV Quantitative Log: <1.18 log IU/mL
HCV RNA, PCR, QN: <15 IU/mL

## 2022-04-16 LAB — HEPATIC FUNCTION PANEL
AG Ratio: 1.1 (calc) (ref 1.0–2.5)
ALT: 13 U/L (ref 6–29)
AST: 27 U/L (ref 10–35)
Albumin: 4.3 g/dL (ref 3.6–5.1)
Alkaline phosphatase (APISO): 93 U/L (ref 37–153)
Globulin: 4 g/dL (calc) — ABNORMAL HIGH (ref 1.9–3.7)
Total Bilirubin: 0.4 mg/dL (ref 0.2–1.2)
Total Protein: 8.3 g/dL — ABNORMAL HIGH (ref 6.1–8.1)

## 2022-04-28 ENCOUNTER — Other Ambulatory Visit: Payer: Self-pay

## 2022-04-28 DIAGNOSIS — F32A Depression, unspecified: Secondary | ICD-10-CM

## 2022-04-28 DIAGNOSIS — G4700 Insomnia, unspecified: Secondary | ICD-10-CM

## 2022-04-28 MED ORDER — SERTRALINE HCL 50 MG PO TABS: 75.0000 mg | ORAL_TABLET | Freq: Every day | ORAL | 3 refills | Status: DC

## 2022-04-28 MED ORDER — TRAZODONE HCL 50 MG PO TABS: 25.0000 mg | ORAL_TABLET | Freq: Every evening | ORAL | 3 refills | Status: DC | PRN

## 2022-04-28 NOTE — Telephone Encounter (Signed)
Please advise KH 

## 2022-06-02 ENCOUNTER — Other Ambulatory Visit: Payer: Self-pay

## 2022-06-02 DIAGNOSIS — F32A Depression, unspecified: Secondary | ICD-10-CM

## 2022-06-02 MED ORDER — SERTRALINE HCL 50 MG PO TABS: 75.0000 mg | ORAL_TABLET | Freq: Every day | ORAL | 3 refills | Status: DC

## 2022-06-02 NOTE — Telephone Encounter (Signed)
Please advise Kh 

## 2022-06-14 ENCOUNTER — Encounter: Payer: Self-pay | Admitting: Internal Medicine

## 2022-06-14 ENCOUNTER — Other Ambulatory Visit: Payer: Self-pay

## 2022-06-14 ENCOUNTER — Ambulatory Visit (INDEPENDENT_AMBULATORY_CARE_PROVIDER_SITE_OTHER): Payer: 59 | Admitting: Internal Medicine

## 2022-06-14 VITALS — BP 145/83 | HR 71 | Temp 97.3°F | Ht 62.0 in | Wt 138.0 lb

## 2022-06-14 DIAGNOSIS — B182 Chronic viral hepatitis C: Secondary | ICD-10-CM | POA: Diagnosis not present

## 2022-06-14 DIAGNOSIS — K7400 Hepatic fibrosis, unspecified: Secondary | ICD-10-CM | POA: Diagnosis not present

## 2022-06-14 DIAGNOSIS — Z23 Encounter for immunization: Secondary | ICD-10-CM | POA: Diagnosis not present

## 2022-06-14 DIAGNOSIS — R21 Rash and other nonspecific skin eruption: Secondary | ICD-10-CM | POA: Diagnosis not present

## 2022-06-14 NOTE — Progress Notes (Signed)
   Subjective:    Patient ID: Roger Vitorino, female    DOB: 02-Oct-1965, 57 y.o.   MRN: [id number]  HPI Toney is here for follow up of chronic hepatitis C. She has completed treatment with Epclusa and doing well.  No new issues. Feels well.     Review of Systems  Constitutional:  Negative for fatigue.  Gastrointestinal:  Negative for diarrhea.       Objective:   Physical Exam Eyes:     General: No scleral icterus. Pulmonary:     Effort: Pulmonary effort is normal.  Neurological:     Mental Status: She is alert.   SH: no alcohol        Assessment & Plan:

## 2022-06-14 NOTE — Assessment & Plan Note (Signed)
She is hepatitis A and B non-immune and will start the vaccine series.

## 2022-06-14 NOTE — Patient Instructions (Signed)
Vaccines today for Hepatitis A and Hepatitis B.  Blood work today to check your Hepatitis C levels.   Follow up in 3 months.  You will need the second round of shots in 3 months when you come for your next appointment.

## 2022-06-14 NOTE — Assessment & Plan Note (Signed)
Minimal fibrosis noted on elastography.  Normal platelets so no further monitoring indicated.   Discussed with her .

## 2022-06-14 NOTE — Assessment & Plan Note (Signed)
She is doing well, now having completed treatment. Will check her labs today including HCV RNA and hepatic function. Will get her back in about 3-4 months for follow up SVR

## 2022-06-15 LAB — HEPATIC FUNCTION PANEL
AG Ratio: 1.3 (calc) (ref 1.0–2.5)
ALT: 9 U/L (ref 6–29)
Globulin: 3.3 g/dL (calc) (ref 1.9–3.7)
Total Bilirubin: 0.3 mg/dL (ref 0.2–1.2)

## 2022-06-17 LAB — HEPATIC FUNCTION PANEL
AST: 16 U/L (ref 10–35)
Albumin: 4.2 g/dL (ref 3.6–5.1)
Alkaline phosphatase (APISO): 91 U/L (ref 37–153)
Bilirubin, Direct: 0 mg/dL (ref 0.0–0.2)
Indirect Bilirubin: 0.3 mg/dL (calc) (ref 0.2–1.2)
Total Protein: 7.5 g/dL (ref 6.1–8.1)

## 2022-06-17 LAB — HEPATITIS C RNA QUANTITATIVE
HCV Quantitative Log: <1.18 log IU/mL
HCV RNA, PCR, QN: <15 IU/mL

## 2022-07-19 ENCOUNTER — Telehealth: Payer: Self-pay

## 2022-07-19 NOTE — Telephone Encounter (Signed)
Lvm for pt to call back to be scheduled for mammo . KH

## 2022-08-29 ENCOUNTER — Other Ambulatory Visit: Payer: Self-pay | Admitting: Nurse Practitioner

## 2022-08-29 DIAGNOSIS — Z1231 Encounter for screening mammogram for malignant neoplasm of breast: Secondary | ICD-10-CM

## 2022-09-13 ENCOUNTER — Inpatient Hospital Stay: Admission: RE | Admit: 2022-09-13 | Payer: 59 | Source: Ambulatory Visit

## 2022-09-14 ENCOUNTER — Ambulatory Visit: Payer: 59 | Admitting: Internal Medicine

## 2023-02-23 ENCOUNTER — Other Ambulatory Visit: Payer: Self-pay | Admitting: Nurse Practitioner

## 2023-02-23 DIAGNOSIS — G4700 Insomnia, unspecified: Secondary | ICD-10-CM

## 2023-02-24 NOTE — Telephone Encounter (Signed)
Please advise [facility]

## 2023-02-28 ENCOUNTER — Other Ambulatory Visit: Payer: Self-pay

## 2023-02-28 DIAGNOSIS — F32A Depression, unspecified: Secondary | ICD-10-CM

## 2023-02-28 MED ORDER — SERTRALINE HCL 50 MG PO TABS
75.0000 mg | ORAL_TABLET | Freq: Every day | ORAL | 3 refills | Status: AC
Start: 2023-02-28 — End: ?

## 2023-02-28 NOTE — Telephone Encounter (Signed)
 Please advise [facility]

## 2023-04-12 ENCOUNTER — Other Ambulatory Visit: Payer: Self-pay | Admitting: Nurse Practitioner

## 2023-04-12 DIAGNOSIS — G4700 Insomnia, unspecified: Secondary | ICD-10-CM

## 2023-06-01 ENCOUNTER — Other Ambulatory Visit: Payer: Self-pay | Admitting: Nurse Practitioner

## 2023-06-01 DIAGNOSIS — G4700 Insomnia, unspecified: Secondary | ICD-10-CM

## 2023-10-02 ENCOUNTER — Other Ambulatory Visit: Payer: Self-pay | Admitting: Nurse Practitioner

## 2023-10-02 DIAGNOSIS — G4700 Insomnia, unspecified: Secondary | ICD-10-CM

## 2023-11-20 ENCOUNTER — Other Ambulatory Visit: Payer: Self-pay | Admitting: Nurse Practitioner

## 2023-11-20 DIAGNOSIS — G4700 Insomnia, unspecified: Secondary | ICD-10-CM

## 2023-11-21 NOTE — Telephone Encounter (Signed)
 Please advise [facility]

## 2024-01-18 ENCOUNTER — Other Ambulatory Visit: Payer: Self-pay | Admitting: Nurse Practitioner

## 2024-01-18 DIAGNOSIS — G4700 Insomnia, unspecified: Secondary | ICD-10-CM

## 2024-01-19 NOTE — Telephone Encounter (Signed)
 Please advise [facility]

## 2024-01-19 NOTE — Telephone Encounter (Signed)
 traZODone  (DESYREL ) 50 MG tablet [Pharmacy Med Name: TRAZODONE  50 MG TAB[*]]

## 2024-01-19 NOTE — Telephone Encounter (Signed)
 Please advise .   Date of Last Office Visit: 11/20/2023     Date of Next Office Visit: Visit date not found   Please call pt to make a  follow up appt. Thank you. KH

## 2024-03-04 IMAGING — CT CT HEAD W/O CM
4 series · 16 of 47 positions shown, 18 images · non-contrast
Comparison: None

CLINICAL DATA: Head trauma, moderate to severe.



[Series 2: head wo · axial · 0.43mm/px · z∈[-157,-22]mm · 7 of 37 slices shown, 9 images]
[im 5/37  brain]
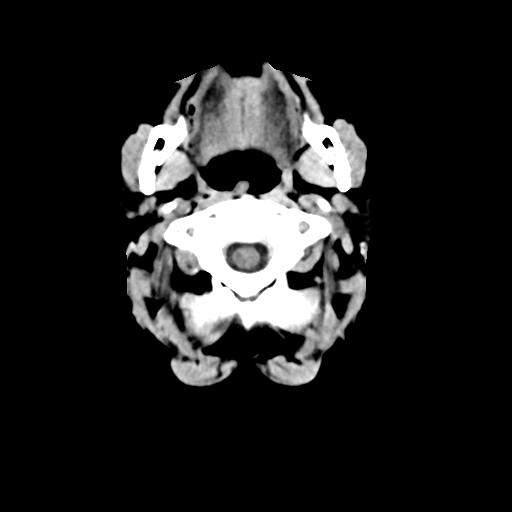
[im 5/37  bone]
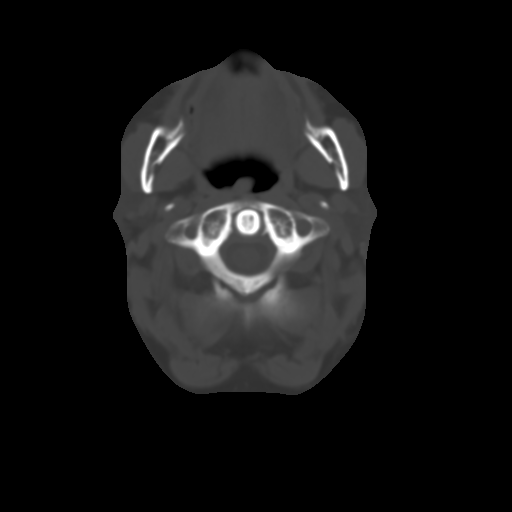
[im 10/37  brain]
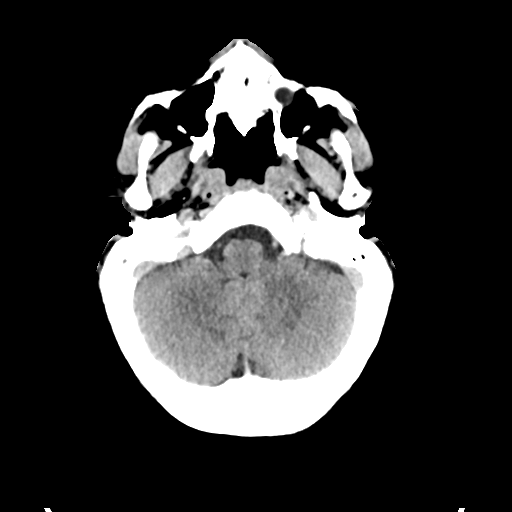
[im 14/37  brain]
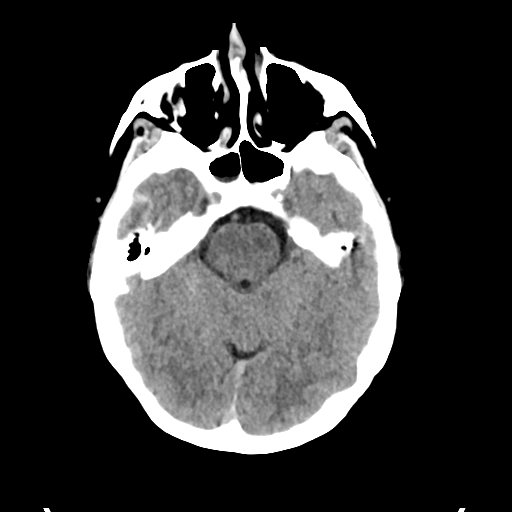
[im 19/37  brain]
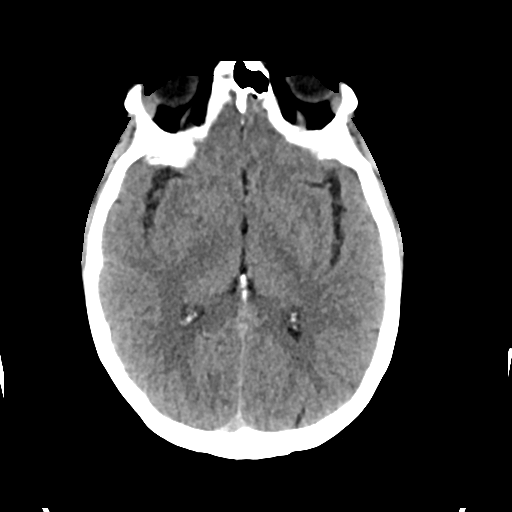
[im 23/37  brain]
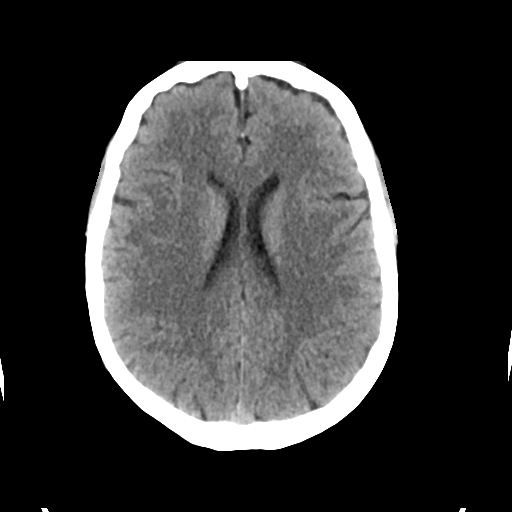
[im 23/37  bone]
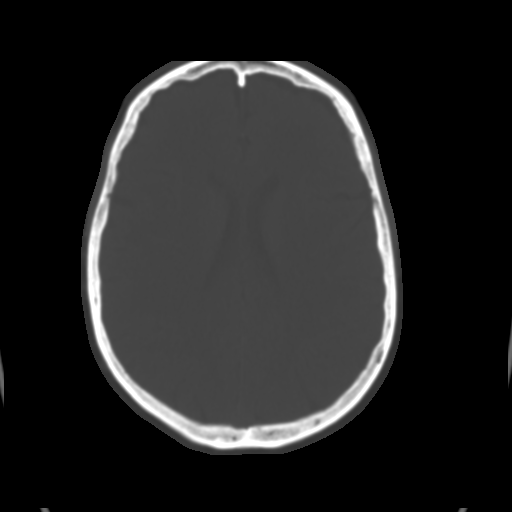
[im 28/37  brain]
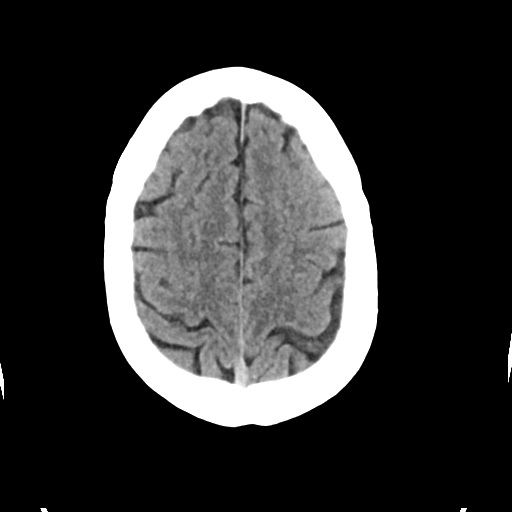
[im 32/37  brain]
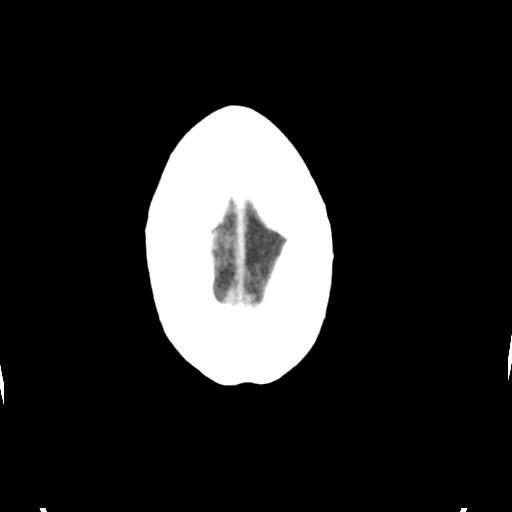

[Series 3: head bone · axial · 0.43mm/px · z∈[-159,-123]mm · 3 of 91 slices shown]
[im 10/91  bone]
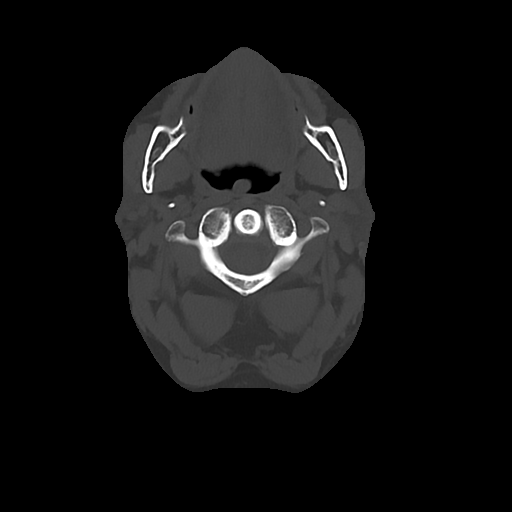
[im 19/91  bone]
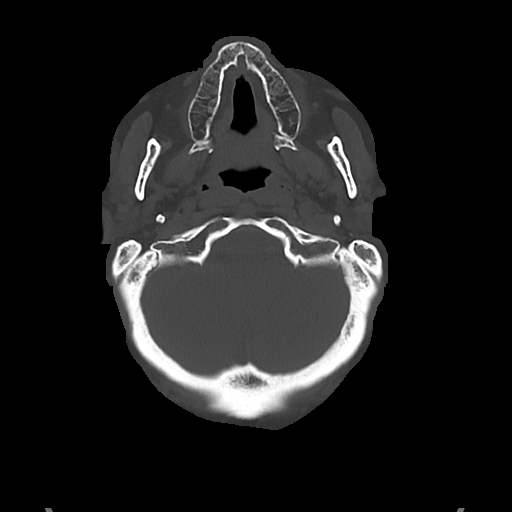
[im 28/91  bone]
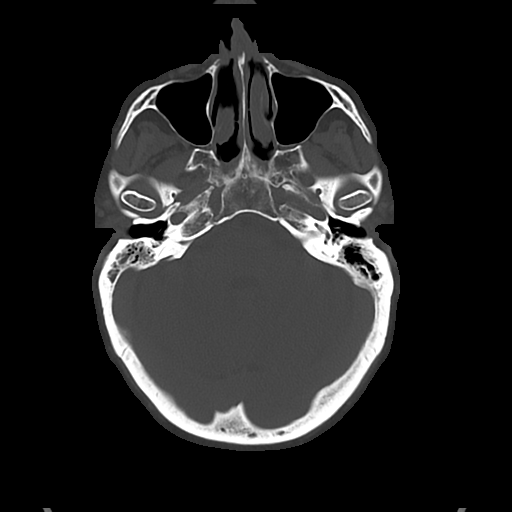

[Series 4: cor soft · coronal · 0.34mm/px · 3 of 64 slices shown]
[im 22/64  brain]
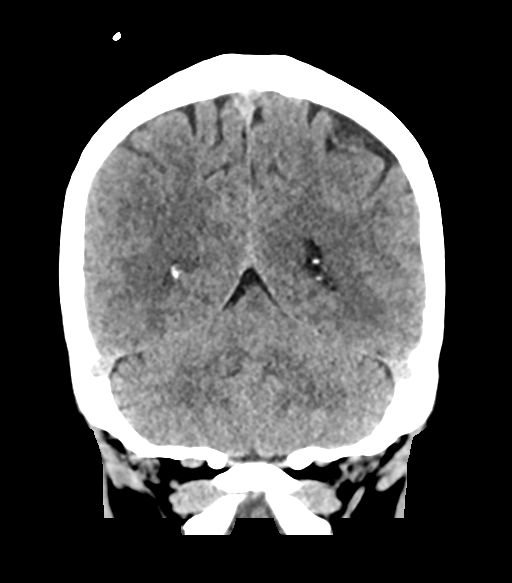
[im 29/64  brain]
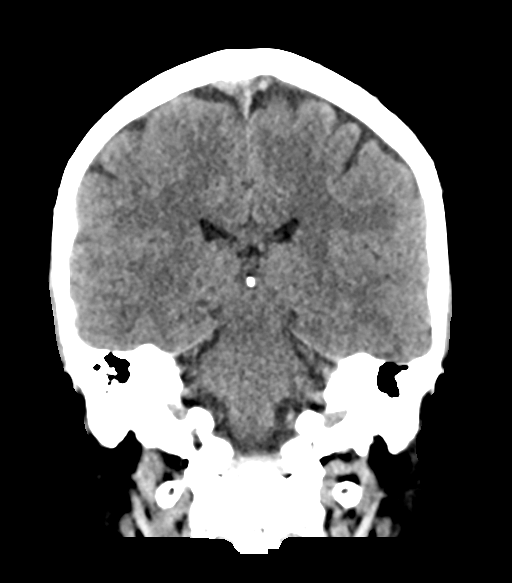
[im 36/64  brain]
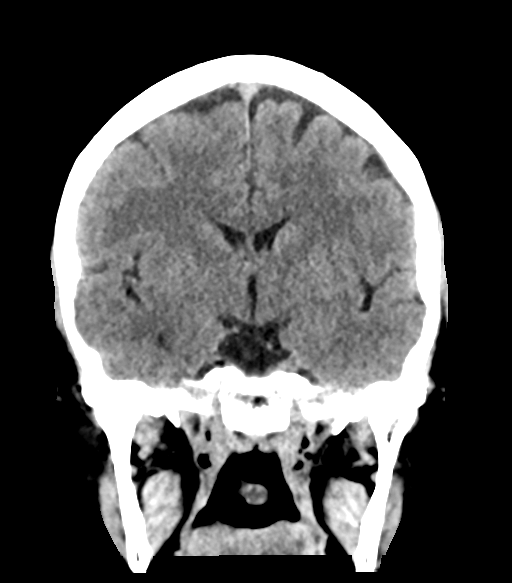

[Series 5: sag soft · sagittal · 0.36mm/px · 3 of 58 slices shown]
[im 20/58  brain]
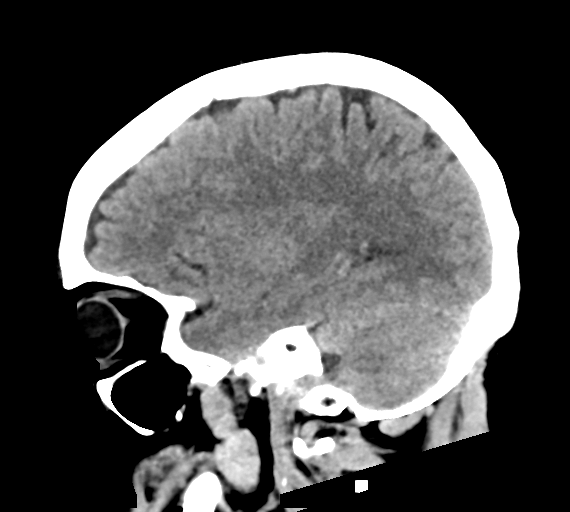
[im 29/58  brain]
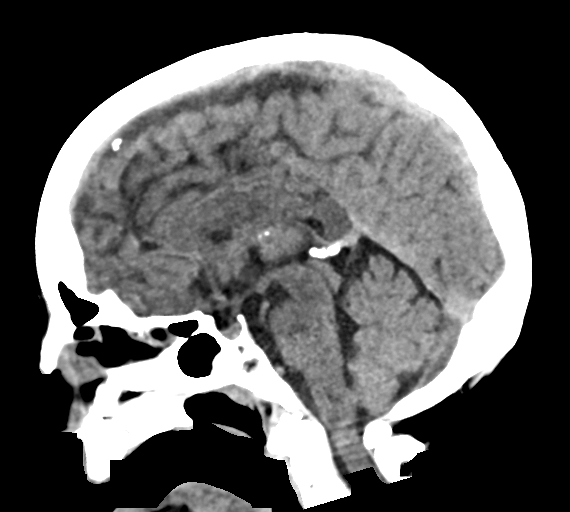
[im 39/58  brain]
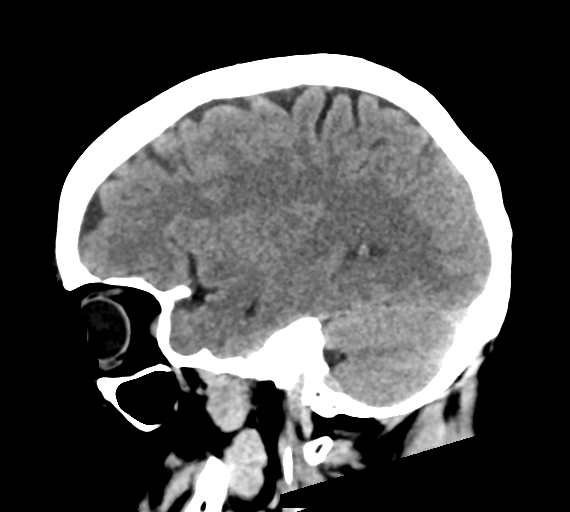

[16 of 47 positions shown; findings below may reference images not displayed]

FINDINGS: Brain: Normal appearance without evidence of old or acute
infarction, mass lesion, hemorrhage, hydrocephalus or extra-axial
collection.

Vascular: There is atherosclerotic calcification of the major
vessels at the base of the brain.

Skull: No skull fracture.

Sinuses/Orbits: Small amount of fluid layering in the sphenoid
sinus. Old medial orbital blowout fracture on the right. Opacified
right ethmoid and diminutive frontal sinuses.

Other: None
IMPRESSION: No intracranial abnormality.  No skull fracture.

Old medial orbital blowout fracture on the right. Opacified right
ethmoid sinuses. Pacified diminutive right frontal sinus. Some
layering fluid in the sphenoid sinuses.

## 2024-03-04 IMAGING — CT CT KNEE*R* W/O CM
3 of 8 series · 14 of 33 positions shown, 18 images · non-contrast
Comparison: Same-day x-ray

CLINICAL DATA: Patellar fracture

EXAM:
CT OF THE RIGHT KNEE WITHOUT CONTRAST
TECHNIQUE: Multidetector CT imaging of the right knee was performed according
to the standard protocol. Multiplanar CT image reconstructions were
also generated.
RADIATION DOSE REDUCTION: This exam was performed according to the
departmental dose-optimization program which includes automated
exposure control, adjustment of the mA and/or kV according to
patient size and/or use of iterative reconstruction technique.

[Series 4: extremity soft tissue · axial · 0.37mm/px · z∈[-1251,-1071]mm · 11 of 108 slices shown, 14 images]
[im 9/108  soft-tissue]
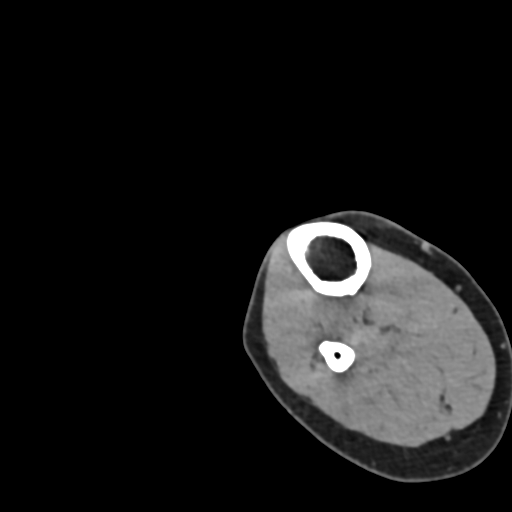
[im 9/108  bone]
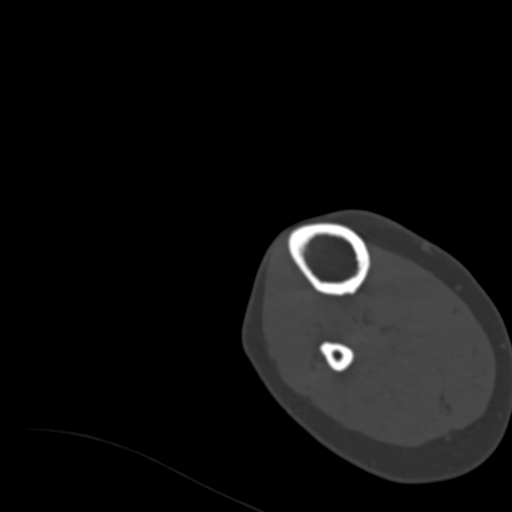
[im 17/108  bone]
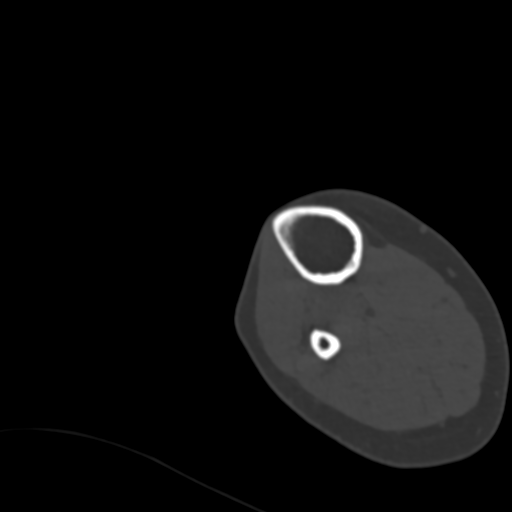
[im 25/108  bone]
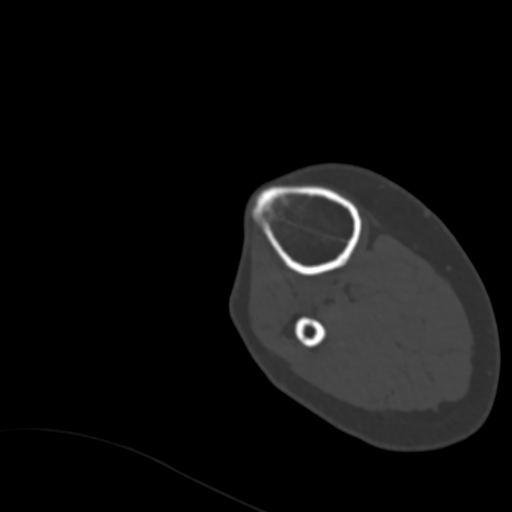
[im 33/108  bone]
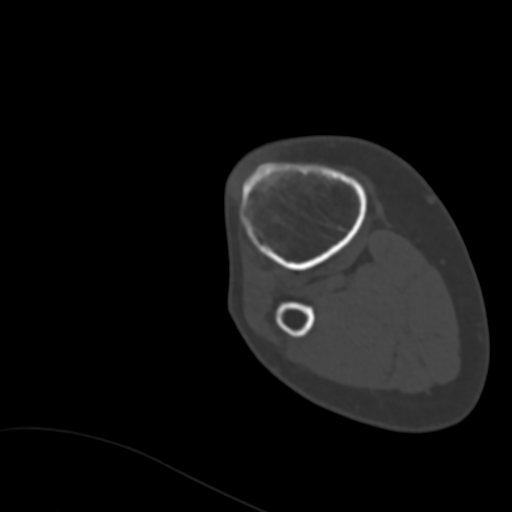
[im 42/108  soft-tissue]
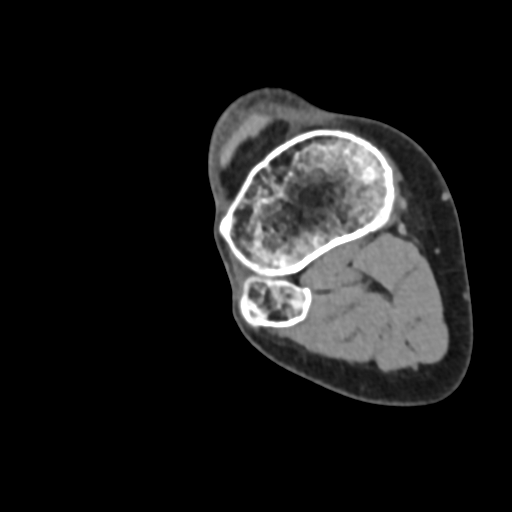
[im 42/108  bone]
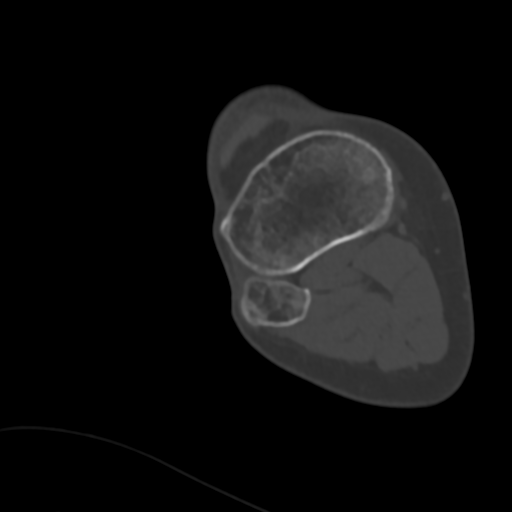
[im 58/108  bone]
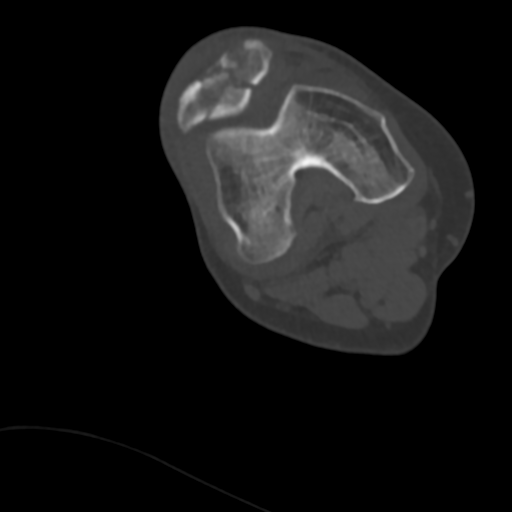
[im 66/108  bone]
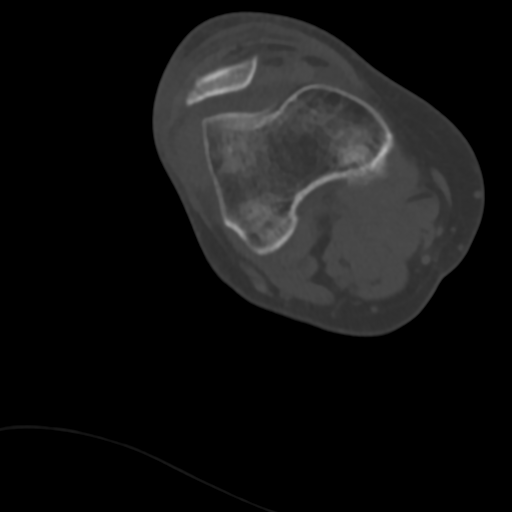
[im 75/108  bone]
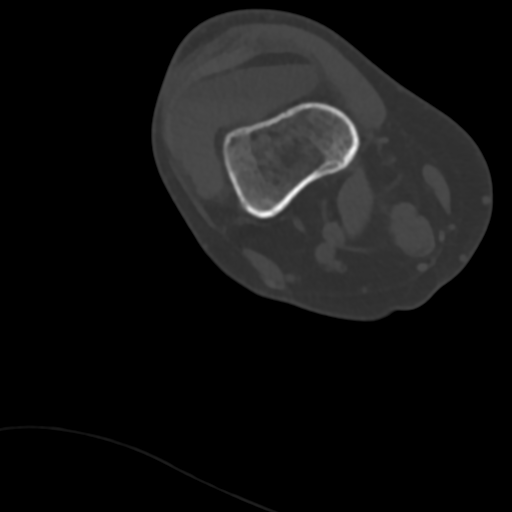
[im 83/108  soft-tissue]
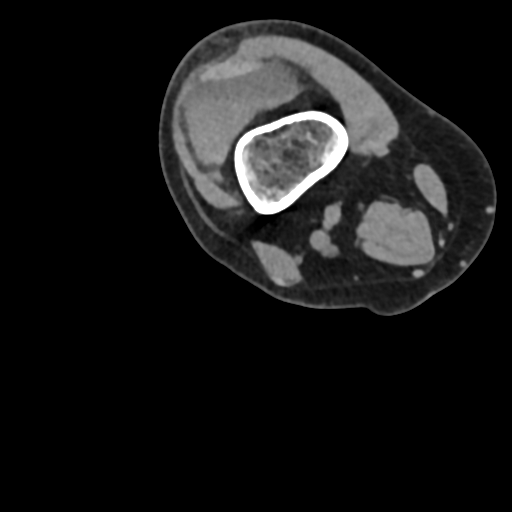
[im 83/108  bone]
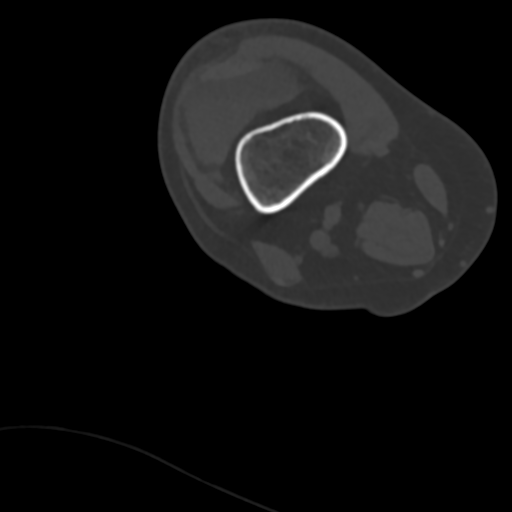
[im 91/108  bone]
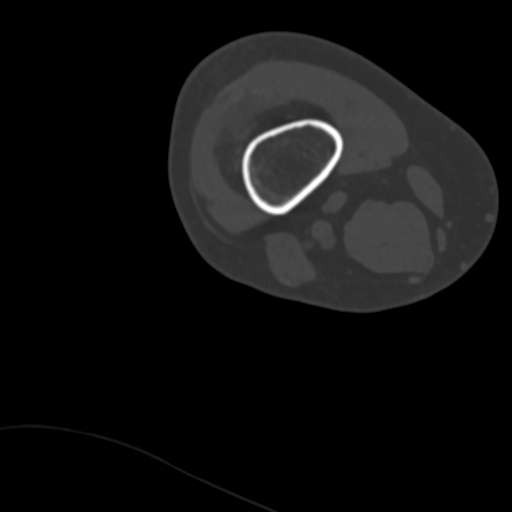
[im 99/108  bone]
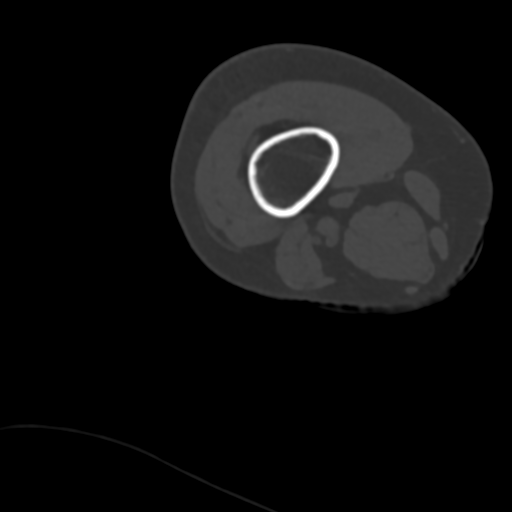

[Series 6: cor bone · coronal · 0.34mm/px · 1 of 87 slices shown]
[im 78/87  bone]
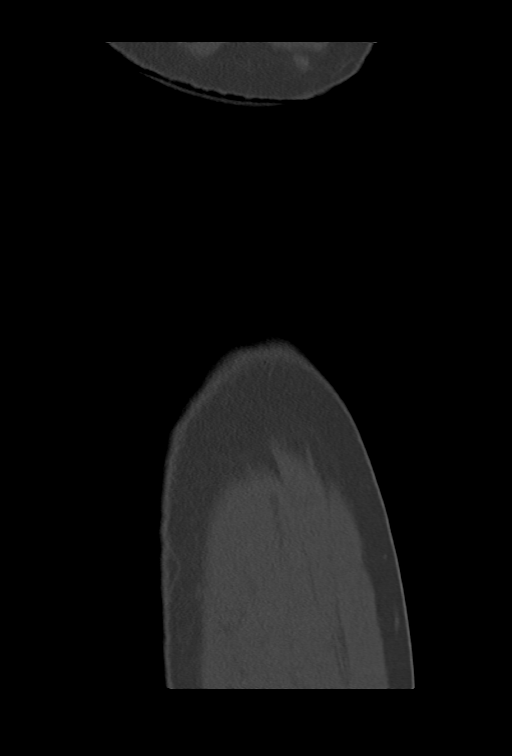

[Series 7: sag bone · sagittal · 0.34mm/px · 2 of 85 slices shown, 3 images]
[im 29/85  soft-tissue]
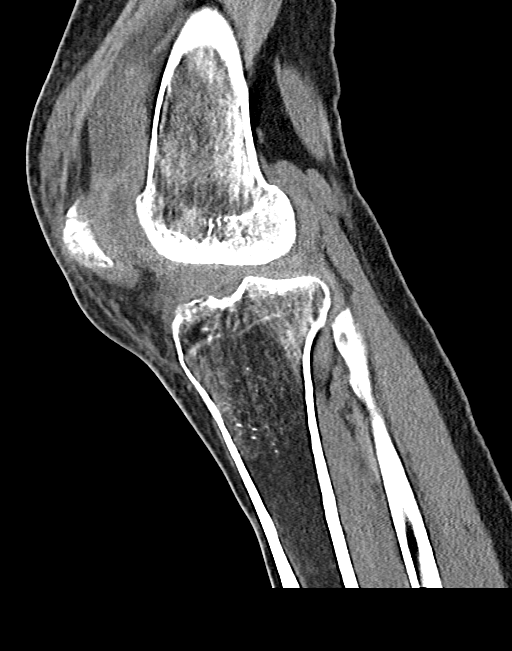
[im 29/85  bone]
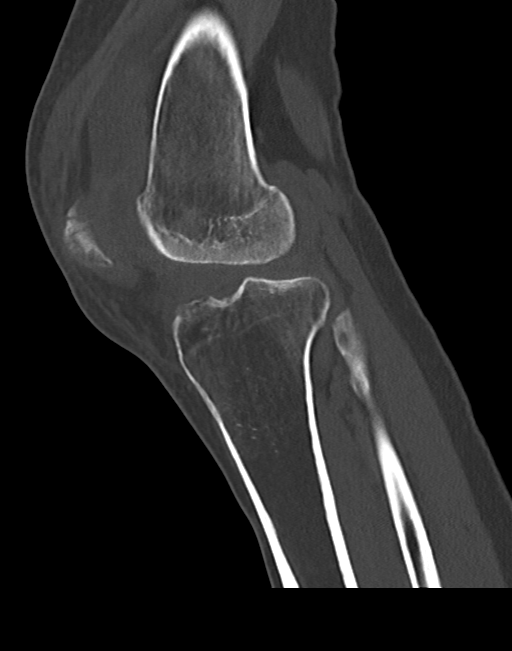
[im 57/85  bone]
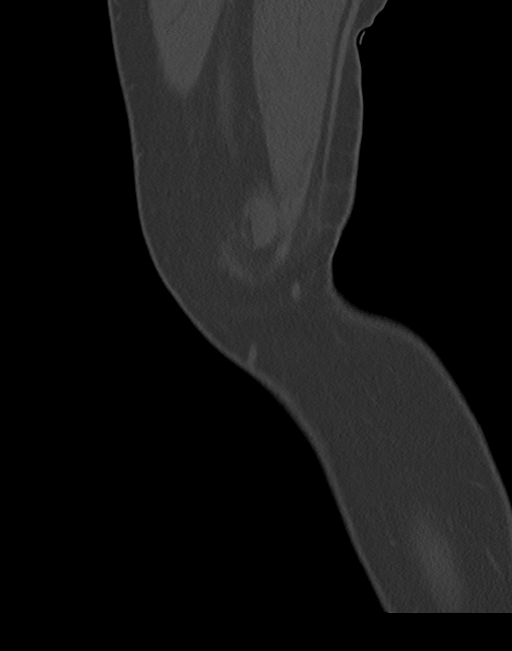

[14 of 33 positions shown; findings below may reference images not displayed]

FINDINGS: Bones/Joint/Cartilage

Acute heavily comminuted fracture through the patella with
longitudinal and transverse components. Fracture extends
intra-articularly to the knee joint with up to 2 mm articular
surface diastasis along the medial patellar facet. Fracture is
relatively nondisplaced. Large layering knee joint lipohemarthrosis.
The included femur, tibia, and fibula are intact without fracture.
Mild medial compartment joint space narrowing with small marginal
osteophytes.

Ligaments

Suboptimally assessed by CT.

Muscles and Tendons

No acute musculotendinous abnormality by CT. The extensor mechanism
appears intact.

Soft tissues

Prepatellar soft tissue swelling.
IMPRESSION: 1. Acute comminuted patellar fracture, as described.
2. Large layering knee joint lipohemarthrosis.
